# Patient Record
Sex: Female | Born: 1970 | Race: White | Hispanic: No | Marital: Married | Smoking: Former smoker
Health system: Southern US, Community
[De-identification: ages and names within clinical notes are randomized; demographics above are authoritative.]

## PROBLEM LIST (undated history)

## (undated) DIAGNOSIS — I10 Essential (primary) hypertension: Secondary | ICD-10-CM

## (undated) DIAGNOSIS — F419 Anxiety disorder, unspecified: Secondary | ICD-10-CM

## (undated) HISTORY — PX: TUBAL LIGATION: SHX77

## (undated) HISTORY — DX: Essential (primary) hypertension: I10

## (undated) HISTORY — PX: BUNIONECTOMY: SHX129

## (undated) HISTORY — PX: WISDOM TOOTH EXTRACTION: SHX21

## (undated) HISTORY — DX: Anxiety disorder, unspecified: F41.9

---

## 2003-09-21 ENCOUNTER — Other Ambulatory Visit: Admission: RE | Admit: 2003-09-21 | Discharge: 2003-09-21 | Payer: Self-pay | Admitting: Obstetrics and Gynecology

## 2004-05-10 ENCOUNTER — Other Ambulatory Visit: Admission: RE | Admit: 2004-05-10 | Discharge: 2004-05-10 | Payer: Self-pay | Admitting: Obstetrics and Gynecology

## 2004-10-25 ENCOUNTER — Other Ambulatory Visit: Admission: RE | Admit: 2004-10-25 | Discharge: 2004-10-25 | Payer: Self-pay | Admitting: Obstetrics and Gynecology

## 2005-04-25 ENCOUNTER — Other Ambulatory Visit: Admission: RE | Admit: 2005-04-25 | Discharge: 2005-04-25 | Payer: Self-pay | Admitting: Obstetrics and Gynecology

## 2010-02-13 ENCOUNTER — Ambulatory Visit: Payer: Self-pay | Admitting: Obstetrics & Gynecology

## 2010-02-13 LAB — CONVERTED CEMR LAB
Clue Cells Wet Prep HPF POC: NONE SEEN
TSH: 1.307 microintl units/mL (ref 0.350–4.500)

## 2010-03-20 ENCOUNTER — Ambulatory Visit: Payer: Self-pay | Admitting: Obstetrics & Gynecology

## 2010-03-20 ENCOUNTER — Ambulatory Visit (HOSPITAL_COMMUNITY): Admission: RE | Admit: 2010-03-20 | Discharge: 2010-03-20 | Payer: Self-pay | Admitting: Obstetrics & Gynecology

## 2010-05-07 ENCOUNTER — Ambulatory Visit: Payer: Self-pay | Admitting: Obstetrics & Gynecology

## 2010-09-04 ENCOUNTER — Ambulatory Visit: Payer: Self-pay | Admitting: Obstetrics & Gynecology

## 2011-02-11 LAB — CBC
HCT: 43.4 % (ref 36.0–46.0)
Hemoglobin: 14.7 g/dL (ref 12.0–15.0)
MCHC: 33.8 g/dL (ref 30.0–36.0)
MCV: 90.8 fL (ref 78.0–100.0)
RBC: 4.78 MIL/uL (ref 3.87–5.11)
RDW: 14.1 % (ref 11.5–15.5)

## 2011-04-08 NOTE — Assessment & Plan Note (Signed)
[name], SHOE                ACCOUNT NO.:  [id number]   MEDICAL RECORD NO.:  [id number]          PATIENT TYPE:  POB   LOCATION:  [facility] at [place]         FACILITY:  [facility]   PHYSICIAN:  Allie Bossier, MD        DATE OF BIRTH:  02/09/1971   DATE OF SERVICE:  05/07/2010                                  CLINIC NOTE   Ms. Katie Faulkner underwent an uncomplicated laparoscopic application of Filshie  clips on March 20, 2010.  She has had intercourse since then and denies  any problems with that.  She had a period on Apr 20, 2010.  She has no  particular GYN complaints.  She said she is under lot of stress due to  her long work schedule (14 hours a day).  I have recommended yoga and  that she come back in August or September for her annual exam.  We will  try to get her records again from Dr. Henderson Cloud.      Allie Bossier, MD     MCD/MEDQ  D:  05/07/2010  T:  05/08/2010  Job:  [id number]

## 2011-04-08 NOTE — Assessment & Plan Note (Signed)
NAMESADYE, KIERNAN                ACCOUNT NO.:  [id number]   MEDICAL RECORD NO.:  [id number]          PATIENT TYPE:  POB   LOCATION:  [facility] [place]         FACILITY:  [facility]   PHYSICIAN:  Allie Bossier, MD        DATE OF BIRTH:  1971/01/11   DATE OF SERVICE:  02/13/2010                                  CLINIC NOTE   Ms. Katie Faulkner is a 40 year old married white gravida 2, para 1, abortus 54  with 36 year old son.  She comes in here today for several reasons.  She  would like to have her tubes tied.  Her husband has been promising to  have a vasectomy, but has yet to do it and she is certain she does not  want more children.  In fact, she was previously scheduled for a tubal  with Dr. Huntley Dec, but there were some reason that it was not done at the  time it was scheduled.  She also complains that she misses periods.  She  has about 8 periods a year if unlucky.  She said she is under a lot of  stress recently and has not had her TSH checked.   PAST MEDICAL HISTORY:  History of ulcerative colitis, but she has not  had a colonoscopy since about 2006 and currently does not see a  gastroenterologist.  She reports being under a lot of stress.  In fact,  she works for [employer] and is going to have to go to [place] for the next 2  weeks for job related issues.  She also has a history of preeclampsia,  but had never been treated for blood pressure.   MEDICATIONS:  Cymbalta 60 mg daily, Benefiber, and Shaklee vitamin.   FAMILY HISTORY:  Negative for breast, GYN, and colon malignancies.   SOCIAL HISTORY:  Positive for daily wine and 3-4 cigarettes a day, but  she denies drug use.   REVIEW OF SYSTEMS:  She has been married for 17 years and monogamous for  21.  Her family practice Sofiah Lyne is Judie Bonus who is a PA at  [employer].  Her Pap smears have always been normal  since 2007 and she rarely has an orgasm.   PAST SURGICAL HISTORY:  C-section, bilateral bunionectomy with  screws  placed in her foot, and an appendectomy.   PHYSICAL EXAMINATION:  VITAL SIGNS:  Height 5 feet 5 inches, weight 111  pounds, blood pressure 145/91, pulse 78.  PELVIC:  Her external genitalia is normal.  Her speculum exam shows a  nulliparous cervix and she has a small amount of white discharge.  Please note that she was treated with Flagyl in the last month.  Bimanual exam, her uterus is normal size and shape, retroverted,  minimally mobile, and nontender.  Adnexa are nontender and without  masses.   ASSESSMENT AND PLAN:  1. Desire for sterility.  I have turned in paperwork for her tubal      ligation.  She understands it is permanent.  She declines      alternative forms of birth control.  2. Followup of her previously treated BV.  I have sent a wet prep.  3. Oligomenorrhea.  I am checking a TSH today, but I feel that her      decrease in number of periods is probably due to her situational      stress.      Allie Bossier, MD     MCD/MEDQ  D:  02/13/2010  T:  02/14/2010  Job:  [id number]

## 2011-04-08 NOTE — Assessment & Plan Note (Signed)
Katie Faulkner, Katie Faulkner                ACCOUNT NO.:  [id number]   MEDICAL RECORD NO.:  [id number]          PATIENT TYPE:  POB   LOCATION:  [facility] [place]         FACILITY:  [facility]   PHYSICIAN:  Allie Bossier, MD        DATE OF BIRTH:  [birth year]   DATE OF SERVICE:  09/04/2010                                  CLINIC NOTE   Katie Faulkner is a 40 year old married white G2, P1, A1.  She has a 13-year-  old son.  Her only complaint today at her annual exam is that she was  seen in the MAU last month for an episode of hematuria, that is the  first and last occasion of that.  She has no urinary symptoms today.   PAST MEDICAL HISTORY:  Ulcerative colitis, history of preeclampsia with  her pregnancy, depression.   REVIEW OF SYSTEMS:  She is very stressed.  She works for __________.  She is married for 18 years, has been monogamous for 21.  She denies  dyspareunia but reports that she is rarely orgasmic.  Pap smears have  always been normal.  The remainder of her review of systems questions  are negative.   MEDICATIONS:  Cymbalta 60 mg daily, Xanax 1 mg at night, trazodone at  night.   FAMILY HISTORY:  Negative for breast, GYN, and colon malignancies.   SOCIAL HISTORY:  She drinks 1-2 glasses of wine a day and she smokes 1-2  cigarettes every evening.   PHYSICAL EXAMINATION:  VITAL SIGNS:  Height 5 feet 5 inches, weight 114,  blood pressure 112/81, pulse 75.  HEENT:  Normal.  HEART:  Regular rate and rhythm.  BREASTS:  Normal bilaterally.  She has fibrocystic changes throughout.  No nipple discharge.  No discrete masses.  No skin changes.  ABDOMEN:  Scaphoid.  No palpable hepatosplenomegaly.  EXTERNAL GENITALIA:  Entirely normal.  Cervix normal.  Bimanual exam,  her uterus is normal size and shape, midplane, it is deviated slightly  to her left.  Adnexa are nontender and there are no masses.   ASSESSMENT AND PLAN:  Annual exam.  I have checked a Pap smear.  Recommended self-breast and  self-vulvar exams.  Recommended stress  management.      Allie Bossier, MD     MCD/MEDQ  D:  09/04/2010  T:  09/05/2010  Job:  [id number]

## 2011-09-30 ENCOUNTER — Ambulatory Visit (INDEPENDENT_AMBULATORY_CARE_PROVIDER_SITE_OTHER): Payer: BC Managed Care – PPO | Admitting: Obstetrics & Gynecology

## 2011-09-30 ENCOUNTER — Encounter: Payer: Self-pay | Admitting: Obstetrics & Gynecology

## 2011-09-30 VITALS — BP 123/81 | HR 67 | Temp 98.6°F | Resp 16 | Ht 64.5 in | Wt 119.0 lb

## 2011-09-30 DIAGNOSIS — Z0000 Encounter for general adult medical examination without abnormal findings: Secondary | ICD-10-CM

## 2011-09-30 DIAGNOSIS — N898 Other specified noninflammatory disorders of vagina: Secondary | ICD-10-CM

## 2011-09-30 DIAGNOSIS — Z01419 Encounter for gynecological examination (general) (routine) without abnormal findings: Secondary | ICD-10-CM

## 2011-09-30 DIAGNOSIS — Z1272 Encounter for screening for malignant neoplasm of vagina: Secondary | ICD-10-CM

## 2011-09-30 DIAGNOSIS — Z113 Encounter for screening for infections with a predominantly sexual mode of transmission: Secondary | ICD-10-CM

## 2011-09-30 NOTE — Progress Notes (Signed)
Subjective:    Katie Faulkner is a 40 y.o. female who presents for an annual exam. She thinks that she may have a yeast infection "for a while". The patient is sexually active. GYN screening history: last pap: was normal. The patient wears seatbelts: yes. The patient participates in regular exercise: yes. Has the patient ever been transfused or tattooed?: no. The patient reports that there is not domestic violence in her life.   Menstrual History: OB History    Grav Para Term Preterm Abortions TAB SAB Ect Mult Living   2 1   1  1   1       Menarche age: 45 Patient's last menstrual period was 08/30/2011.    The following portions of the patient's history were reviewed and updated as appropriate: allergies, current medications, past family history, past medical history, past social history, past surgical history and problem list.  Review of Systems A comprehensive review of systems was negative.  She has been married for 19 years and is rarely sexually active, still works for [employer], travels extensively for work, has a 51 yo son   Objective:    BP 123/81  Pulse 67  Temp(Src) 98.6 F (37 C) (Oral)  Resp 16  Ht 5' 4.5" (1.638 m)  Wt 53.978 kg (119 lb)  BMI 20.11 kg/m2  LMP 08/30/2011  General Appearance:    Alert, cooperative, no distress, appears stated age  Head:    Normocephalic, without obvious abnormality, atraumatic  Eyes:    PERRL, conjunctiva/corneas clear, EOM's intact, fundi    benign, both eyes  Ears:    Normal TM's and external ear canals, both ears  Nose:   Nares normal, septum midline, mucosa normal, no drainage    or sinus tenderness  Throat:   Lips, mucosa, and tongue normal; teeth and gums normal  Neck:   Supple, symmetrical, trachea midline, no adenopathy;    thyroid:  no enlargement/tenderness/nodules; no carotid   bruit or JVD  Back:     Symmetric, no curvature, ROM normal, no CVA tenderness  Lungs:     Clear to auscultation bilaterally, respirations unlabored    Chest Wall:    No tenderness or deformity   Heart:    Regular rate and rhythm, S1 and S2 normal, no murmur, rub   or gallop  Breast Exam:    No tenderness, masses, or nipple abnormality  Abdomen:     Soft, non-tender, bowel sounds active all four quadrants,    no masses, no organomegaly  Genitalia:    Normal female without lesion, discharge or tenderness, uterus NSS,midplane, relatively non-mobile, adnexa without masses or tenderness     Extremities:   Extremities normal, atraumatic, no cyanosis or edema  Pulses:   2+ and symmetric all extremities  Skin:   Skin color, texture, turgor normal, no rashes or lesions  Lymph nodes:   Cervical, supraclavicular, and axillary nodes normal  Neurologic:   CNII-XII intact, normal strength, sensation and reflexes    throughout  .    Assessment:    Healthy female exam.    Plan:     Thin prep Pap smear.  I will send a wet prep and treat accordingly.

## 2011-10-01 LAB — WET PREP, GENITAL: WBC, Wet Prep HPF POC: NONE SEEN

## 2011-10-20 ENCOUNTER — Telehealth: Payer: Self-pay | Admitting: *Deleted

## 2011-10-20 DIAGNOSIS — B373 Candidiasis of vulva and vagina: Secondary | ICD-10-CM

## 2011-10-20 MED ORDER — FLUCONAZOLE 150 MG PO TABS: 150.0000 mg | ORAL_TABLET | Freq: Once | ORAL | Status: AC

## 2011-10-20 NOTE — Telephone Encounter (Signed)
Pt called with c/o of vaginal itching and vaginal burning.  She has been on 2 courses of antibiotics recently for sinus infection.  She feels like she has a yeast infection and requesting Diflucan if possible.  Rx sent to [facility]  OK'd per Dr Marice Potter.

## 2012-09-01 ENCOUNTER — Other Ambulatory Visit: Payer: Self-pay | Admitting: Obstetrics & Gynecology

## 2012-09-01 DIAGNOSIS — Z1231 Encounter for screening mammogram for malignant neoplasm of breast: Secondary | ICD-10-CM

## 2012-10-19 ENCOUNTER — Encounter: Payer: Self-pay | Admitting: Obstetrics & Gynecology

## 2012-10-19 ENCOUNTER — Ambulatory Visit: Payer: BC Managed Care – PPO

## 2012-10-19 ENCOUNTER — Ambulatory Visit (INDEPENDENT_AMBULATORY_CARE_PROVIDER_SITE_OTHER): Payer: BC Managed Care – PPO | Admitting: Obstetrics & Gynecology

## 2012-10-19 ENCOUNTER — Ambulatory Visit (HOSPITAL_BASED_OUTPATIENT_CLINIC_OR_DEPARTMENT_OTHER): Payer: BC Managed Care – PPO

## 2012-10-19 ENCOUNTER — Ambulatory Visit (HOSPITAL_BASED_OUTPATIENT_CLINIC_OR_DEPARTMENT_OTHER)
Admission: RE | Admit: 2012-10-19 | Discharge: 2012-10-19 | Disposition: A | Discharge status: Home / Self Care | Payer: BC Managed Care – PPO | Source: Ambulatory Visit | Attending: Obstetrics & Gynecology | Admitting: Obstetrics & Gynecology

## 2012-10-19 VITALS — BP 125/81 | HR 67 | Resp 16 | Ht 65.0 in | Wt 126.0 lb

## 2012-10-19 DIAGNOSIS — Z01419 Encounter for gynecological examination (general) (routine) without abnormal findings: Secondary | ICD-10-CM

## 2012-10-19 DIAGNOSIS — Z0000 Encounter for general adult medical examination without abnormal findings: Secondary | ICD-10-CM

## 2012-10-19 DIAGNOSIS — Z1231 Encounter for screening mammogram for malignant neoplasm of breast: Secondary | ICD-10-CM | POA: Insufficient documentation

## 2012-10-19 DIAGNOSIS — Z1151 Encounter for screening for human papillomavirus (HPV): Secondary | ICD-10-CM

## 2012-10-19 DIAGNOSIS — Z124 Encounter for screening for malignant neoplasm of cervix: Secondary | ICD-10-CM

## 2012-10-19 NOTE — Progress Notes (Signed)
Subjective:    Katie Faulkner is a 41 y.o. female who presents for an annual exam. The patient has no complaints today. The patient is sexually active. GYN screening history: last pap: was normal. The patient wears seatbelts: yes. The patient participates in regular exercise: no. Has the patient ever been transfused or tattooed?: no. The patient reports that there is not domestic violence in her life.   Menstrual History: OB History    Grav Para Term Preterm Abortions TAB SAB Ect Mult Living   2 1   1  1   1       Menarche age: 71 Patient's last menstrual period was 10/12/2012.    The following portions of the patient's history were reviewed and updated as appropriate: allergies, current medications, past family history, past medical history, past social history, past surgical history and problem list.  Review of Systems A comprehensive review of systems was negative. She has been married for 20 years and denies dyspareunia. She had a mammogram today. She had a flu shot 09-28-12   Objective:    BP 125/81  Pulse 67  Resp 16  Ht 5\' 5"  (1.651 m)  Wt 126 lb (57.153 kg)  BMI 20.97 kg/m2  LMP 10/12/2012  General Appearance:    Alert, cooperative, no distress, appears stated age  Head:    Normocephalic, without obvious abnormality, atraumatic  Eyes:    PERRL, conjunctiva/corneas clear, EOM's intact, fundi    benign, both eyes  Ears:    Normal TM's and external ear canals, both ears  Nose:   Nares normal, septum midline, mucosa normal, no drainage    or sinus tenderness  Throat:   Lips, mucosa, and tongue normal; teeth and gums normal  Neck:   Supple, symmetrical, trachea midline, no adenopathy;    thyroid:  no enlargement/tenderness/nodules; no carotid   bruit or JVD  Back:     Symmetric, no curvature, ROM normal, no CVA tenderness  Lungs:     Clear to auscultation bilaterally, respirations unlabored  Chest Wall:    No tenderness or deformity   Heart:    Regular rate and rhythm, S1 and  S2 normal, no murmur, rub   or gallop  Breast Exam:    No tenderness, masses, or nipple abnormality  Abdomen:     Soft, non-tender, bowel sounds active all four quadrants,    no masses, no organomegaly  Genitalia:    Normal female without lesion, discharge or tenderness, NSSR, NT, no adnexal masses or tenderness     Extremities:   Extremities normal, atraumatic, no cyanosis or edema  Pulses:   2+ and symmetric all extremities  Skin:   Skin color, texture, turgor normal, no rashes or lesions  Lymph nodes:   Cervical, supraclavicular, and axillary nodes normal  Neurologic:   CNII-XII intact, normal strength, sensation and reflexes    throughout  .    Assessment:    Healthy female exam.    Plan:     Thin prep Pap smear.

## 2013-03-29 ENCOUNTER — Encounter: Payer: Self-pay | Admitting: Obstetrics & Gynecology

## 2013-03-29 ENCOUNTER — Ambulatory Visit (INDEPENDENT_AMBULATORY_CARE_PROVIDER_SITE_OTHER): Payer: BC Managed Care – PPO | Admitting: Obstetrics & Gynecology

## 2013-03-29 VITALS — BP 140/86 | HR 66 | Resp 16 | Ht 65.0 in | Wt 120.0 lb

## 2013-03-29 DIAGNOSIS — N898 Other specified noninflammatory disorders of vagina: Secondary | ICD-10-CM

## 2013-03-29 DIAGNOSIS — L293 Anogenital pruritus, unspecified: Secondary | ICD-10-CM

## 2013-03-29 NOTE — Progress Notes (Signed)
  Subjective:    Patient ID: Katie Faulkner, female    DOB: 05-02-1971, 42 y.o.   MRN: [id number]  HPI  42 yo separated W P1 (51 yo son) who is here because of a 2 week h/o vaginal itching. She tried OTC monistat without relief. She did have unprotected sex (She has had a BTL). She mentions that since her BTL her periods have become longer and heavier.  Review of Systems     Objective:   Physical Exam  Thin, whitish discharge, no yeast- like curds      Assessment & Plan:  Vaginal discharge- await lab results. She declines flagyl for BV (wants to drink wine) I have recommended condoms with sex for STI protection Heavy periods- She has mentioned that she might be interested in an ablation. I said that if she wants to pursue this that I will need to do CBC, TSH, and gyn u/s.

## 2013-03-30 ENCOUNTER — Telehealth: Payer: Self-pay | Admitting: *Deleted

## 2013-03-30 DIAGNOSIS — B9689 Other specified bacterial agents as the cause of diseases classified elsewhere: Secondary | ICD-10-CM

## 2013-03-30 LAB — WET PREP BY MOLECULAR PROBE
Candida species: NEGATIVE
Gardnerella vaginalis: POSITIVE — AB

## 2013-03-30 MED ORDER — METRONIDAZOLE 500 MG PO TABS: 500.0000 mg | ORAL_TABLET | Freq: Two times a day (BID) | ORAL | Status: DC

## 2013-03-30 NOTE — Telephone Encounter (Signed)
LM on voicemail that her wet prep did show BV and RX for Flagyl sent to her pharmacy.

## 2013-04-05 ENCOUNTER — Telehealth: Payer: Self-pay | Admitting: *Deleted

## 2013-04-05 DIAGNOSIS — N926 Irregular menstruation, unspecified: Secondary | ICD-10-CM

## 2013-04-05 MED ORDER — NORGESTREL-ETHINYL ESTRADIOL 0.3-30 MG-MCG PO TABS: 1.0000 | ORAL_TABLET | Freq: Every day | ORAL | Status: DC

## 2013-04-05 NOTE — Telephone Encounter (Signed)
Pt called wanted to start OCP's today instead of going ahead with an ablation.  Spoke with Dr Marice Potter and she RX Lo Ovral.  This was sent to [facility].

## 2013-04-22 ENCOUNTER — Telehealth: Payer: Self-pay | Admitting: *Deleted

## 2013-04-22 DIAGNOSIS — B373 Candidiasis of vulva and vagina: Secondary | ICD-10-CM

## 2013-04-22 MED ORDER — TERCONAZOLE 0.4 % VA CREA: 1.0000 | TOPICAL_CREAM | Freq: Every day | VAGINAL | Status: DC

## 2013-04-22 NOTE — Telephone Encounter (Signed)
Pt called with c/o's of vaginal yeast with discharge and itching.  Requested a RX for Terazol 7 be sent to [facility].

## 2013-05-05 ENCOUNTER — Ambulatory Visit (INDEPENDENT_AMBULATORY_CARE_PROVIDER_SITE_OTHER): Payer: BC Managed Care – PPO | Admitting: Obstetrics & Gynecology

## 2013-05-05 ENCOUNTER — Encounter: Payer: Self-pay | Admitting: Obstetrics & Gynecology

## 2013-05-05 VITALS — BP 136/89 | HR 81 | Resp 16 | Ht 65.0 in

## 2013-05-05 DIAGNOSIS — L292 Pruritus vulvae: Secondary | ICD-10-CM

## 2013-05-05 DIAGNOSIS — L293 Anogenital pruritus, unspecified: Secondary | ICD-10-CM

## 2013-05-05 NOTE — Progress Notes (Signed)
  Subjective:    Patient ID: Katie Faulkner, female    DOB: 12/31/1970, 42 y.o.   MRN: [id number]  HPI  Ms. Mays is here because of severe external vulvar itching. She has taken flagyl, Terazol, and tried OTC Monistat without relief.  Review of Systems     Objective:   Physical Exam  Normal appearing vulva/vagina/discharge. No odor noted.      Assessment & Plan:   Vulvar itching- wet prep sent I have recommended OTC cortisone with aloe until results are available Check HSV 2 IGg

## 2013-05-06 ENCOUNTER — Telehealth: Payer: Self-pay | Admitting: *Deleted

## 2013-05-06 DIAGNOSIS — N760 Acute vaginitis: Secondary | ICD-10-CM

## 2013-05-06 DIAGNOSIS — B9689 Other specified bacterial agents as the cause of diseases classified elsewhere: Secondary | ICD-10-CM

## 2013-05-06 LAB — WET PREP, GENITAL: Yeast Wet Prep HPF POC: NONE SEEN

## 2013-05-06 MED ORDER — METRONIDAZOLE 500 MG PO TABS: 500.0000 mg | ORAL_TABLET | Freq: Two times a day (BID) | ORAL | Status: DC

## 2013-05-06 NOTE — Telephone Encounter (Signed)
Pt notified of positive BV and [facility] 7 days.

## 2013-05-25 ENCOUNTER — Telehealth: Payer: Self-pay | Admitting: *Deleted

## 2013-05-25 DIAGNOSIS — B373 Candidiasis of vulva and vagina: Secondary | ICD-10-CM

## 2013-05-25 MED ORDER — FLUCONAZOLE 150 MG PO TABS: ORAL_TABLET | ORAL | Status: DC

## 2013-05-25 NOTE — Telephone Encounter (Signed)
Pt called with c/o's vaginal itching and discharge.  She has been swimming a lot and states that she has a yeast infection.  Per Dr Marice Potter may call in Diflucan 150 mg

## 2013-07-29 ENCOUNTER — Telehealth: Payer: Self-pay | Admitting: *Deleted

## 2013-07-29 DIAGNOSIS — B9689 Other specified bacterial agents as the cause of diseases classified elsewhere: Secondary | ICD-10-CM

## 2013-07-29 MED ORDER — METRONIDAZOLE 500 MG PO TABS: 500.0000 mg | ORAL_TABLET | Freq: Two times a day (BID) | ORAL | Status: DC

## 2013-07-29 NOTE — Telephone Encounter (Signed)
Pt called adv has Hx of BV and wanted Flagyl sent to pharm - Pt is currently in [place] but will be back after we are closed for the weekend - sent meds to pharm

## 2013-10-26 ENCOUNTER — Encounter: Payer: Self-pay | Admitting: Obstetrics & Gynecology

## 2013-10-26 ENCOUNTER — Ambulatory Visit (INDEPENDENT_AMBULATORY_CARE_PROVIDER_SITE_OTHER): Payer: BC Managed Care – PPO | Admitting: Obstetrics & Gynecology

## 2013-10-26 VITALS — BP 124/83 | HR 65 | Resp 16 | Ht 65.0 in | Wt 134.0 lb

## 2013-10-26 DIAGNOSIS — Z01419 Encounter for gynecological examination (general) (routine) without abnormal findings: Secondary | ICD-10-CM

## 2013-10-26 DIAGNOSIS — N926 Irregular menstruation, unspecified: Secondary | ICD-10-CM

## 2013-10-26 DIAGNOSIS — Z0000 Encounter for general adult medical examination without abnormal findings: Secondary | ICD-10-CM

## 2013-10-26 DIAGNOSIS — Z124 Encounter for screening for malignant neoplasm of cervix: Secondary | ICD-10-CM

## 2013-10-26 DIAGNOSIS — Z1151 Encounter for screening for human papillomavirus (HPV): Secondary | ICD-10-CM

## 2013-10-26 MED ORDER — NORGESTREL-ETHINYL ESTRADIOL 0.3-30 MG-MCG PO TABS: ORAL_TABLET | ORAL | Status: DC

## 2013-10-26 NOTE — Progress Notes (Signed)
Subjective:    Katie Faulkner is a 42 y.o. female who presents for an annual exam. The patient has no complaints today. The patient is sexually active. GYN screening history: last pap: was normal. The patient wears seatbelts: yes. The patient participates in regular exercise: yes. ( walking) Has the patient ever been transfused or tattooed?: no. The patient reports that there is not domestic violence in her life.   Menstrual History: OB History   Grav Para Term Preterm Abortions TAB SAB Ect Mult Living   2 1 1  1  1   1       Menarche age: 36 Coitarche: 4   Patient's last menstrual period was 09/26/2013.    The following portions of the patient's history were reviewed and updated as appropriate: allergies, current medications, past family history, past medical history, past social history, past surgical history and problem list.  Review of Systems A comprehensive review of systems was negative. Separated for 9 months, plans a divorce. Monogamous for 6 months. Using OCPs without condoms. Working for [employer]. She has occasional hot flashes.   Objective:    BP 124/83  Pulse 65  Resp 16  Ht 5\' 5"  (1.651 m)  Wt 134 lb (60.782 kg)  BMI 22.30 kg/m2  LMP 09/26/2013  General Appearance:    Alert, cooperative, no distress, appears stated age  Head:    Normocephalic, without obvious abnormality, atraumatic  Eyes:    PERRL, conjunctiva/corneas clear, EOM's intact, fundi    benign, both eyes  Ears:    Normal TM's and external ear canals, both ears  Nose:   Nares normal, septum midline, mucosa normal, no drainage    or sinus tenderness  Throat:   Lips, mucosa, and tongue normal; teeth and gums normal  Neck:   Supple, symmetrical, trachea midline, no adenopathy;    thyroid:  no enlargement/tenderness/nodules; no carotid   bruit or JVD  Back:     Symmetric, no curvature, ROM normal, no CVA tenderness  Lungs:     Clear to auscultation bilaterally, respirations unlabored  Chest Wall:    No  tenderness or deformity   Heart:    Regular rate and rhythm, S1 and S2 normal, no murmur, rub   or gallop  Breast Exam:    No tenderness, masses, or nipple abnormality  Abdomen:     Soft, non-tender, bowel sounds active all four quadrants,    no masses, no organomegaly  Genitalia:    Normal female without lesion, discharge or tenderness, NSSR, NT, normal adnexal exam     Extremities:   Extremities normal, atraumatic, no cyanosis or edema  Pulses:   2+ and symmetric all extremities  Skin:   Skin color, texture, turgor normal, no rashes or lesions  Lymph nodes:   Cervical, supraclavicular, and axillary nodes normal  Neurologic:   CNII-XII intact, normal strength, sensation and reflexes    throughout  .    Assessment:    Healthy female exam.    Plan:     Breast self exam technique reviewed and patient encouraged to perform self-exam monthly. Chlamydia specimen. GC specimen. Mammogram. Thin prep Pap smear.

## 2013-10-27 ENCOUNTER — Telehealth: Payer: Self-pay | Admitting: *Deleted

## 2013-10-27 ENCOUNTER — Ambulatory Visit: Payer: BC Managed Care – PPO

## 2013-10-27 LAB — GC/CHLAMYDIA PROBE AMP, URINE: GC Probe Amp, Urine: NEGATIVE

## 2013-10-27 NOTE — Telephone Encounter (Signed)
Pt notified of neg cultures from 12/14

## 2013-11-03 ENCOUNTER — Ambulatory Visit (INDEPENDENT_AMBULATORY_CARE_PROVIDER_SITE_OTHER): Payer: BC Managed Care – PPO

## 2013-11-03 DIAGNOSIS — Z0000 Encounter for general adult medical examination without abnormal findings: Secondary | ICD-10-CM

## 2013-11-03 DIAGNOSIS — Z1231 Encounter for screening mammogram for malignant neoplasm of breast: Secondary | ICD-10-CM

## 2014-02-20 ENCOUNTER — Telehealth: Payer: Self-pay | Admitting: *Deleted

## 2014-02-20 NOTE — Telephone Encounter (Signed)
Pt called stating that she missed a pill last week but then doubled up the pills for 1 day to get back on track.  She then started bleeding and has been bleeding x 10 days.  Instructed to stop OCP's and restart a new pill pack on Sunday..Marland Kitchen

## 2014-09-25 ENCOUNTER — Encounter: Payer: Self-pay | Admitting: Obstetrics & Gynecology

## 2014-09-29 ENCOUNTER — Other Ambulatory Visit: Payer: Self-pay | Admitting: Obstetrics & Gynecology

## 2014-09-29 DIAGNOSIS — Z9289 Personal history of other medical treatment: Secondary | ICD-10-CM

## 2014-11-01 ENCOUNTER — Ambulatory Visit (INDEPENDENT_AMBULATORY_CARE_PROVIDER_SITE_OTHER): Payer: BC Managed Care – PPO | Admitting: Obstetrics & Gynecology

## 2014-11-01 ENCOUNTER — Encounter: Payer: Self-pay | Admitting: Obstetrics & Gynecology

## 2014-11-01 VITALS — BP 153/95 | HR 77 | Resp 16 | Ht 65.0 in | Wt 137.0 lb

## 2014-11-01 DIAGNOSIS — Z0000 Encounter for general adult medical examination without abnormal findings: Secondary | ICD-10-CM

## 2014-11-01 DIAGNOSIS — Z1151 Encounter for screening for human papillomavirus (HPV): Secondary | ICD-10-CM | POA: Diagnosis not present

## 2014-11-01 DIAGNOSIS — Z01419 Encounter for gynecological examination (general) (routine) without abnormal findings: Secondary | ICD-10-CM | POA: Diagnosis not present

## 2014-11-01 DIAGNOSIS — Z124 Encounter for screening for malignant neoplasm of cervix: Secondary | ICD-10-CM

## 2014-11-01 DIAGNOSIS — Z113 Encounter for screening for infections with a predominantly sexual mode of transmission: Secondary | ICD-10-CM

## 2014-11-01 NOTE — Progress Notes (Signed)
Subjective:    Katie HusbandsDeborah Faulkner is a 43 y.o. female who presents for an annual exam. The patient has no complaints today. The patient is sexually active. GYN screening history: last pap: was normal. The patient wears seatbelts: yes. The patient participates in regular exercise: yes. Has the patient ever been transfused or tattooed?: no. The patient reports that there is not domestic violence in her life.   Menstrual History: OB History    Gravida Para Term Preterm AB TAB SAB Ectopic Multiple Living   2 1 1  1  1   1       Menarche age: [age]  Patient's last menstrual period was 10/24/2014.    The following portions of the patient's history were reviewed and updated as appropriate: allergies, current medications, past family history, past medical history, past social history, past surgical history and problem list.  Review of Systems A comprehensive review of systems was negative. Works at [employer], lots of stress, mother died 2/15. Monogamous for 18 months. Denies dyspareunia. Periods last about 10 days, heavy 3 of those days, declines Mirena/ablation. She has had a BTL. She has already had a flu vaccine this season. She gets fasting labs at work.   Objective:    BP 153/95 mmHg  Pulse 77  Resp 16  Ht 5\' 5"  (1.651 m)  Wt 137 lb (62.143 kg)  BMI 22.80 kg/m2  LMP 10/24/2014  General Appearance:    Alert, cooperative, no distress, appears stated age  Head:    Normocephalic, without obvious abnormality, atraumatic  Eyes:    PERRL, conjunctiva/corneas clear, EOM's intact, fundi    benign, both eyes  Ears:    Normal TM's and external ear canals, both ears  Nose:   Nares normal, septum midline, mucosa normal, no drainage    or sinus tenderness  Throat:   Lips, mucosa, and tongue normal; teeth and gums normal  Neck:   Supple, symmetrical, trachea midline, no adenopathy;    thyroid:  no enlargement/tenderness/nodules; no carotid   bruit or JVD  Back:     Symmetric, no curvature, ROM normal, no CVA  tenderness  Lungs:     Clear to auscultation bilaterally, respirations unlabored  Chest Wall:    No tenderness or deformity   Heart:    Regular rate and rhythm, S1 and S2 normal, no murmur, rub   or gallop  Breast Exam:    No tenderness, masses, or nipple abnormality  Abdomen:     Soft, non-tender, bowel sounds active all four quadrants,    no masses, no organomegaly  Genitalia:    Normal female without lesion, discharge or tenderness     Extremities:   Extremities normal, atraumatic, no cyanosis or edema  Pulses:   2+ and symmetric all extremities  Skin:   Skin color, texture, turgor normal, no rashes or lesions  Lymph nodes:   Cervical, supraclavicular, and axillary nodes normal  Neurologic:   CNII-XII intact, normal strength, sensation and reflexes    throughout  .    Assessment:    Healthy female exam.    Plan:     Breast self exam technique reviewed and patient encouraged to perform self-exam monthly. Chlamydia specimen. GC specimen. Mammogram. Thin prep Pap smear. with cotesting

## 2014-11-02 ENCOUNTER — Telehealth: Payer: Self-pay | Admitting: *Deleted

## 2014-11-02 LAB — HIV ANTIBODY (ROUTINE TESTING W REFLEX): HIV 1&2 Ab, 4th Generation: NONREACTIVE

## 2014-11-02 LAB — RPR

## 2014-11-02 LAB — HEPATITIS C ANTIBODY: HCV AB: NEGATIVE

## 2014-11-02 LAB — HEPATITIS B SURFACE ANTIGEN: Hepatitis B Surface Ag: NEGATIVE

## 2014-11-02 NOTE — Telephone Encounter (Signed)
Called pt to adv labs are normal but still waiting on PAP. Pt expressed understanding.

## 2014-11-03 LAB — CYTOLOGY - PAP

## 2014-11-08 ENCOUNTER — Ambulatory Visit (INDEPENDENT_AMBULATORY_CARE_PROVIDER_SITE_OTHER): Payer: BC Managed Care – PPO

## 2014-11-08 ENCOUNTER — Ambulatory Visit: Payer: BC Managed Care – PPO

## 2014-11-08 DIAGNOSIS — Z1231 Encounter for screening mammogram for malignant neoplasm of breast: Secondary | ICD-10-CM

## 2014-11-08 DIAGNOSIS — Z9289 Personal history of other medical treatment: Secondary | ICD-10-CM

## 2015-10-05 ENCOUNTER — Other Ambulatory Visit (HOSPITAL_COMMUNITY): Payer: Self-pay | Admitting: Obstetrics & Gynecology

## 2015-10-05 DIAGNOSIS — Z1231 Encounter for screening mammogram for malignant neoplasm of breast: Secondary | ICD-10-CM

## 2015-11-15 ENCOUNTER — Ambulatory Visit: Payer: Self-pay | Admitting: Obstetrics & Gynecology

## 2015-11-15 ENCOUNTER — Ambulatory Visit: Payer: Self-pay

## 2015-12-06 ENCOUNTER — Ambulatory Visit: Payer: Self-pay

## 2015-12-06 ENCOUNTER — Ambulatory Visit: Payer: Self-pay | Admitting: Obstetrics & Gynecology

## 2015-12-19 ENCOUNTER — Encounter: Payer: Self-pay | Admitting: Obstetrics & Gynecology

## 2015-12-19 ENCOUNTER — Ambulatory Visit (INDEPENDENT_AMBULATORY_CARE_PROVIDER_SITE_OTHER): Payer: BLUE CROSS/BLUE SHIELD | Admitting: Obstetrics & Gynecology

## 2015-12-19 ENCOUNTER — Ambulatory Visit (INDEPENDENT_AMBULATORY_CARE_PROVIDER_SITE_OTHER): Payer: BLUE CROSS/BLUE SHIELD

## 2015-12-19 VITALS — BP 108/77 | HR 79 | Resp 16 | Ht 65.0 in | Wt 134.0 lb

## 2015-12-19 DIAGNOSIS — Z01419 Encounter for gynecological examination (general) (routine) without abnormal findings: Secondary | ICD-10-CM | POA: Diagnosis not present

## 2015-12-19 DIAGNOSIS — Z1231 Encounter for screening mammogram for malignant neoplasm of breast: Secondary | ICD-10-CM | POA: Diagnosis not present

## 2015-12-19 DIAGNOSIS — Z36 Encounter for antenatal screening of mother: Secondary | ICD-10-CM

## 2015-12-19 DIAGNOSIS — N920 Excessive and frequent menstruation with regular cycle: Secondary | ICD-10-CM | POA: Diagnosis not present

## 2015-12-19 DIAGNOSIS — Z124 Encounter for screening for malignant neoplasm of cervix: Secondary | ICD-10-CM | POA: Diagnosis not present

## 2015-12-19 DIAGNOSIS — Z1151 Encounter for screening for human papillomavirus (HPV): Secondary | ICD-10-CM | POA: Diagnosis not present

## 2015-12-19 DIAGNOSIS — Z0000 Encounter for general adult medical examination without abnormal findings: Secondary | ICD-10-CM

## 2015-12-19 LAB — CBC
HEMATOCRIT: 43.4 % (ref 36.0–46.0)
HEMOGLOBIN: 14.1 g/dL (ref 12.0–15.0)
MCH: 28.7 pg (ref 26.0–34.0)
MCHC: 32.5 g/dL (ref 30.0–36.0)
MCV: 88.2 fL (ref 78.0–100.0)
MPV: 11.4 fL (ref 8.6–12.4)
Platelets: 252 10*3/uL (ref 150–400)
RBC: 4.92 MIL/uL (ref 3.87–5.11)
RDW: 13.6 % (ref 11.5–15.5)
WBC: 7.6 10*3/uL (ref 4.0–10.5)

## 2015-12-19 NOTE — Progress Notes (Signed)
Subjective:    Katie Faulkner is a 45 y.o. MW  female who presents for an annual exam. The patient has no complaints today. The patient is sexually active. GYN screening history: last pap: was normal. The patient wears seatbelts: yes. The patient participates in regular exercise: yes. Has the patient ever been transfused or tattooed?: no. The patient reports that there is not domestic violence in her life.   Menstrual History: OB History    Gravida Para Term Preterm AB TAB SAB Ectopic Multiple Living   Menarche age: 58  Patient's last menstrual period was 12/09/2015.    The following portions of the patient's history were reviewed and updated as appropriate: allergies, current medications, past family history, past medical history, past social history, past surgical history and problem list.  Review of Systems Pertinent items noted in HPI and remainder of comprehensive ROS otherwise negative.  She got married last year. She denies dyspareunia.She works for  [employer]. She has had a recent flu vaccine and mammogram. She gets her fasting labs at work.   Objective:    BP 108/77 mmHg  Pulse 79  Resp 16  Ht  (1.651 m)  Wt 134 lb (60.782 kg)  BMI 22.30 kg/m2  LMP 12/09/2015  General Appearance:    Alert, cooperative, no distress, appears stated age  Head:    Normocephalic, without obvious abnormality, atraumatic  Eyes:    PERRL, conjunctiva/corneas clear, EOM's intact, fundi    benign, both eyes  Ears:    Normal TM's and external ear canals, both ears  Nose:   Nares normal, septum midline, mucosa normal, no drainage    or sinus tenderness  Throat:   Lips, mucosa, and tongue normal; teeth and gums normal  Neck:   Supple, symmetrical, trachea midline, no adenopathy;    thyroid:  no enlargement/tenderness/nodules; no carotid   bruit or JVD  Back:     Symmetric, no curvature, ROM normal, no CVA tenderness  Lungs:     Clear to auscultation bilaterally, respirations  unlabored  Chest Wall:    No tenderness or deformity   Heart:    Regular rate and rhythm, S1 and S2 normal, no murmur, rub   or gallop  Breast Exam:    No tenderness, masses, or nipple abnormality  Abdomen:     Soft, non-tender, bowel sounds active all four quadrants,    no masses, no organomegaly  Genitalia:    Normal female without lesion, discharge or tenderness, NSSR, NT, mobile, normal adnexal exam     Extremities:   Extremities normal, atraumatic, no cyanosis or edema  Pulses:   2+ and symmetric all extremities  Skin:   Skin color, texture, turgor normal, no rashes or lesions  Lymph nodes:   Cervical, supraclavicular, and axillary nodes normal  Neurologic:   CNII-XII intact, normal strength, sensation and reflexes    throughout  .    Assessment:    Healthy female exam.   Heavy periods   Plan:     Breast self exam technique reviewed and patient encouraged to perform self-exam monthly. Thin prep Pap smear. with cotesting (She is aware of ACOG recs) Check TSH, cbc, and gyn u/s Discussed ablation/Mirena

## 2015-12-20 ENCOUNTER — Telehealth: Payer: Self-pay | Admitting: *Deleted

## 2015-12-20 LAB — TSH: TSH: 1.255 u[IU]/mL (ref 0.350–4.500)

## 2015-12-20 NOTE — Telephone Encounter (Signed)
Copy of normal labs mailed to pt,

## 2015-12-24 LAB — CYTOLOGY - PAP

## 2016-10-28 ENCOUNTER — Other Ambulatory Visit (HOSPITAL_COMMUNITY): Payer: Self-pay | Admitting: Obstetrics & Gynecology

## 2016-10-28 DIAGNOSIS — Z1231 Encounter for screening mammogram for malignant neoplasm of breast: Secondary | ICD-10-CM

## 2016-12-23 ENCOUNTER — Ambulatory Visit: Payer: BLUE CROSS/BLUE SHIELD | Admitting: Obstetrics and Gynecology

## 2016-12-23 ENCOUNTER — Ambulatory Visit: Payer: BLUE CROSS/BLUE SHIELD

## 2017-01-02 ENCOUNTER — Encounter: Payer: Self-pay | Admitting: Advanced Practice Midwife

## 2017-01-02 ENCOUNTER — Ambulatory Visit (INDEPENDENT_AMBULATORY_CARE_PROVIDER_SITE_OTHER): Payer: BLUE CROSS/BLUE SHIELD | Admitting: Advanced Practice Midwife

## 2017-01-02 ENCOUNTER — Ambulatory Visit (INDEPENDENT_AMBULATORY_CARE_PROVIDER_SITE_OTHER): Payer: BLUE CROSS/BLUE SHIELD

## 2017-01-02 VITALS — BP 122/85 | HR 75 | Ht 65.0 in | Wt 130.0 lb

## 2017-01-02 DIAGNOSIS — Z0000 Encounter for general adult medical examination without abnormal findings: Secondary | ICD-10-CM

## 2017-01-02 DIAGNOSIS — Z01419 Encounter for gynecological examination (general) (routine) without abnormal findings: Secondary | ICD-10-CM | POA: Diagnosis not present

## 2017-01-02 DIAGNOSIS — Z1239 Encounter for other screening for malignant neoplasm of breast: Secondary | ICD-10-CM

## 2017-01-02 DIAGNOSIS — N898 Other specified noninflammatory disorders of vagina: Secondary | ICD-10-CM

## 2017-01-02 DIAGNOSIS — N939 Abnormal uterine and vaginal bleeding, unspecified: Secondary | ICD-10-CM

## 2017-01-02 DIAGNOSIS — Z1231 Encounter for screening mammogram for malignant neoplasm of breast: Secondary | ICD-10-CM

## 2017-01-02 NOTE — Progress Notes (Signed)
Subjective:     Patient ID: Katie Faulkner, female   DOB: 03-11-1971, 46 y.o.   MRN: [id number]  HPI Here for annual Gyn exam. Still having heavy periods lasting 5-6 days. 27-28 day cycles. No Change.  No dizziness. Last Pap 11/2015 Nml, but absent TZ, neg HPV. Mammogram 11/2015 Nml. Has one scheduled later today.    Review of Systems  Constitutional: Negative for appetite change, chills, fatigue, fever and unexpected weight change.  Respiratory: Negative for shortness of breath.   Cardiovascular: Negative for chest pain.  Gastrointestinal: Negative for abdominal distention, abdominal pain, constipation, diarrhea, nausea and vomiting.  Genitourinary: Positive for vaginal discharge. Negative for difficulty urinating, dysuria, genital sores, hematuria, urgency, vaginal bleeding and vaginal pain.  Neurological: Negative for dizziness and weakness.       Objective:   Physical Exam  Constitutional: She is oriented to person, place, and time. She appears well-developed and well-nourished. No distress.  HENT:  Head: Atraumatic.  Eyes: Conjunctivae are normal. No scleral icterus.  Neck: No thyromegaly present.  Cardiovascular: Normal rate and regular rhythm.  Exam reveals no gallop and no friction rub.   No murmur heard. Pulmonary/Chest: Effort normal and breath sounds normal. No respiratory distress.  Abdominal: Soft. Bowel sounds are normal. She exhibits no distension. There is no tenderness.  Genitourinary: Uterus normal. Vaginal discharge (thin, clear-white) found.  Musculoskeletal: She exhibits no edema or tenderness.  Lymphadenopathy:    She has no cervical adenopathy.  Neurological: She is alert and oriented to person, place, and time. She has normal reflexes.  Skin: Skin is warm and dry.  Psychiatric: She has a normal mood and affect.  Nursing note and vitals reviewed.      Assessment:     1. Vaginal discharge  - Cervicovaginal ancillary only  2. Breast screening  -  Cervicovaginal ancillary only  3. Encounter for gynecological examination without abnormal finding  - Cervicovaginal ancillary only  4. Abnormal uterine bleeding (AUB)  - Cervicovaginal ancillary only     Plan:     F/I 1 year Discussed Mirena or ablation for AUB--declines  [place] Xaniyah Buchholz, CNM 01/02/2017 12:24 PM

## 2017-01-05 LAB — CERVICOVAGINAL ANCILLARY ONLY
BACTERIAL VAGINITIS: NEGATIVE
Candida vaginitis: NEGATIVE
Chlamydia: NEGATIVE
Neisseria Gonorrhea: NEGATIVE

## 2017-01-08 ENCOUNTER — Ambulatory Visit: Payer: BLUE CROSS/BLUE SHIELD | Admitting: Obstetrics & Gynecology

## 2017-11-09 ENCOUNTER — Other Ambulatory Visit (HOSPITAL_COMMUNITY): Payer: Self-pay | Admitting: Obstetrics & Gynecology

## 2017-11-09 DIAGNOSIS — Z1231 Encounter for screening mammogram for malignant neoplasm of breast: Secondary | ICD-10-CM

## 2018-01-07 ENCOUNTER — Encounter: Payer: Self-pay | Admitting: Obstetrics & Gynecology

## 2018-01-07 ENCOUNTER — Ambulatory Visit (INDEPENDENT_AMBULATORY_CARE_PROVIDER_SITE_OTHER): Payer: BLUE CROSS/BLUE SHIELD

## 2018-01-07 ENCOUNTER — Ambulatory Visit (INDEPENDENT_AMBULATORY_CARE_PROVIDER_SITE_OTHER): Payer: BLUE CROSS/BLUE SHIELD | Admitting: Obstetrics & Gynecology

## 2018-01-07 VITALS — BP 116/72 | HR 79 | Wt 133.0 lb

## 2018-01-07 DIAGNOSIS — Z01419 Encounter for gynecological examination (general) (routine) without abnormal findings: Secondary | ICD-10-CM | POA: Diagnosis not present

## 2018-01-07 DIAGNOSIS — Z1151 Encounter for screening for human papillomavirus (HPV): Secondary | ICD-10-CM

## 2018-01-07 DIAGNOSIS — Z124 Encounter for screening for malignant neoplasm of cervix: Secondary | ICD-10-CM | POA: Diagnosis not present

## 2018-01-07 DIAGNOSIS — Z1231 Encounter for screening mammogram for malignant neoplasm of breast: Secondary | ICD-10-CM

## 2018-01-07 NOTE — Progress Notes (Signed)
Subjective:  She is having all the symptoms of menopause, especially right before her periods. Her anus has been itchy for about 2 weeks, has tried Prep H. Periods are lasting about 7days.  The patient is sexually active. GYN screening history: last pap: was normal. The patient wears seatbelts: yes. The patient participates in regular exercise: yes. Has the patient ever been transfused or tattooed?: no. The patient reports that there is not domestic violence in her life.   Menstrual History: OB History    Gravida Para Term Preterm AB Living   2 1 1   1 1    SAB TAB Ectopic Multiple Live Births   1       1      Menarche age: [age] No LMP recorded.    The following portions of the patient's history were reviewed and updated as appropriate: allergies, current medications, past family history, past medical history, past social history, past surgical history and problem list.  Review of Systems Pertinent items are noted in HPI.   Husband has had a vasectomy She has had a BTL Married for 2 1/2 years FH- no breast/gyn/colon cancer She has had a flu vaccine   Objective:    BP 116/72   Pulse 79   Wt 133 lb (60.3 kg)   BMI 22.13 kg/m   General Appearance:    Alert, cooperative, no distress, appears stated age  Head:    Normocephalic, without obvious abnormality, atraumatic  Eyes:    PERRL, conjunctiva/corneas clear, EOM's intact, fundi    benign, both eyes  Ears:    Normal TM's and external ear canals, both ears  Nose:   Nares normal, septum midline, mucosa normal, no drainage    or sinus tenderness  Throat:   Lips, mucosa, and tongue normal; teeth and gums normal  Neck:   Supple, symmetrical, trachea midline, no adenopathy;    thyroid:  no enlargement/tenderness/nodules; no carotid   bruit or JVD  Back:     Symmetric, no curvature, ROM normal, no CVA tenderness  Lungs:     Clear to auscultation bilaterally, respirations unlabored  Chest Wall:    No tenderness or deformity   Heart:     Regular rate and rhythm, S1 and S2 normal, no murmur, rub   or gallop  Breast Exam:    No tenderness, masses, or nipple abnormality  Abdomen:     Soft, non-tender, bowel sounds active all four quadrants,    no masses, no organomegaly  Genitalia:    Normal female without lesion, discharge or tenderness, normal size and shape, retroverted, mobile, non-tender, normal adnexal exam Her anus appears normal     Extremities:   Extremities normal, atraumatic, no cyanosis or edema  Pulses:   2+ and symmetric all extremities  Skin:   Skin color, texture, turgor normal, no rashes or lesions  Lymph nodes:   Cervical, supraclavicular, and axillary nodes normal  Neurologic:   CNII-XII intact, normal strength, sensation and reflexes    throughout  .    Assessment:    Healthy female exam.    Plan:     Thin prep Pap smear. with cotesting

## 2018-01-12 LAB — CYTOLOGY - PAP
Adequacy: ABSENT
DIAGNOSIS: NEGATIVE
HPV (WINDOPATH): NOT DETECTED

## 2018-11-29 ENCOUNTER — Other Ambulatory Visit: Payer: Self-pay | Admitting: Obstetrics & Gynecology

## 2018-11-29 DIAGNOSIS — Z1231 Encounter for screening mammogram for malignant neoplasm of breast: Secondary | ICD-10-CM

## 2019-01-13 ENCOUNTER — Ambulatory Visit (INDEPENDENT_AMBULATORY_CARE_PROVIDER_SITE_OTHER): Payer: BLUE CROSS/BLUE SHIELD

## 2019-01-13 ENCOUNTER — Ambulatory Visit (INDEPENDENT_AMBULATORY_CARE_PROVIDER_SITE_OTHER): Payer: BLUE CROSS/BLUE SHIELD | Admitting: Obstetrics & Gynecology

## 2019-01-13 ENCOUNTER — Encounter: Payer: Self-pay | Admitting: Obstetrics & Gynecology

## 2019-01-13 VITALS — BP 136/89 | HR 79 | Ht 65.0 in | Wt 137.0 lb

## 2019-01-13 DIAGNOSIS — Z23 Encounter for immunization: Secondary | ICD-10-CM

## 2019-01-13 DIAGNOSIS — Z1231 Encounter for screening mammogram for malignant neoplasm of breast: Secondary | ICD-10-CM | POA: Diagnosis not present

## 2019-01-13 DIAGNOSIS — Z124 Encounter for screening for malignant neoplasm of cervix: Secondary | ICD-10-CM | POA: Diagnosis not present

## 2019-01-13 DIAGNOSIS — Z01419 Encounter for gynecological examination (general) (routine) without abnormal findings: Secondary | ICD-10-CM

## 2019-01-13 DIAGNOSIS — Z1151 Encounter for screening for human papillomavirus (HPV): Secondary | ICD-10-CM

## 2019-01-13 MED ORDER — CIPROFLOXACIN HCL 500 MG PO TABS: 500.0000 mg | ORAL_TABLET | Freq: Two times a day (BID) | ORAL | 6 refills | Status: DC

## 2019-01-13 MED ORDER — FLAVOXATE HCL 100 MG PO TABS: 100.0000 mg | ORAL_TABLET | Freq: Three times a day (TID) | ORAL | 1 refills | Status: DC | PRN

## 2019-01-13 NOTE — Progress Notes (Signed)
Subjective:    Katie Faulkner is a 48 y.o.married P1 (63 yo son) female who presents for an annual exam. She has about 3 UTIs per year. She has been seen by urology and had a renal scan with no etiology discovered. She is requesting that I prescribed cipro when she has UTIs. She already takes bactrim after sex.  The patient is sexually active. GYN screening history: last pap: was normal. The patient wears seatbelts: yes. The patient participates in regular exercise: yes. Has the patient ever been transfused or tattooed?: no. The patient reports that there is not domestic violence in her life.   Menstrual History: OB History    Gravida  2   Para  1   Term  1   Preterm      AB  1   Living  1     SAB  1   TAB      Ectopic      Multiple      Live Births  1           Menarche age: 89 Patient's last menstrual period was 01/03/2019.    The following portions of the patient's history were reviewed and updated as appropriate: allergies, current medications, past family history, past medical history, past social history, past surgical history and problem list.  Review of Systems Pertinent items are noted in HPI.   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)   Objective:    BP 136/89   Pulse 79   Ht 5\' 5"  (1.651 m)   Wt 137 lb (62.1 kg)   LMP 01/03/2019   BMI 22.80 kg/m   General Appearance:    Alert, cooperative, no distress, appears stated age  Head:    Normocephalic, without obvious abnormality, atraumatic  Eyes:    PERRL, conjunctiva/corneas clear, EOM's intact, fundi    benign, both eyes  Ears:    Normal TM's and external ear canals, both ears  Nose:   Nares normal, septum midline, mucosa normal, no drainage    or sinus tenderness  Throat:   Lips, mucosa, and tongue normal; teeth and gums normal  Neck:   Supple, symmetrical, trachea midline,  no adenopathy;    thyroid:  no enlargement/tenderness/nodules; no carotid   bruit or JVD  Back:     Symmetric, no curvature, ROM normal, no CVA tenderness  Lungs:     Clear to auscultation bilaterally, respirations unlabored  Chest Wall:    No tenderness or deformity   Heart:    Regular rate and rhythm, S1 and S2 normal, no murmur, rub   or gallop  Breast Exam:    No tenderness, masses, or nipple abnormality  Abdomen:     Soft, non-tender, bowel sounds active all four quadrants,    no masses, no organomegaly  Genitalia:    Normal female without lesion, discharge or tenderness, normal size and shape, anteverted, mobile, non-tender, normal adnexal exam      Extremities:   Extremities normal, atraumatic, no cyanosis or edema  Pulses:   2+ and symmetric all extremities  Skin:   Skin color, texture, turgor normal, no rashes or lesions  Lymph nodes:   Cervical, supraclavicular, and axillary nodes normal  Neurologic:   CNII-XII intact, normal strength, sensation and reflexes    throughout  .    Assessment:  Healthy female exam.   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 next year cipro prn Urispas prn TDAP today

## 2019-01-13 NOTE — Progress Notes (Signed)
Last pap- 01/07/18- negative

## 2019-01-18 LAB — CYTOLOGY - PAP
Diagnosis: NEGATIVE
HPV (WINDOPATH): NOT DETECTED

## 2019-10-04 DIAGNOSIS — D485 Neoplasm of uncertain behavior of skin: Secondary | ICD-10-CM | POA: Diagnosis not present

## 2019-10-04 DIAGNOSIS — L249 Irritant contact dermatitis, unspecified cause: Secondary | ICD-10-CM | POA: Diagnosis not present

## 2019-10-04 DIAGNOSIS — L820 Inflamed seborrheic keratosis: Secondary | ICD-10-CM | POA: Diagnosis not present

## 2019-10-14 DIAGNOSIS — G4700 Insomnia, unspecified: Secondary | ICD-10-CM | POA: Diagnosis not present

## 2019-10-14 DIAGNOSIS — F3341 Major depressive disorder, recurrent, in partial remission: Secondary | ICD-10-CM | POA: Diagnosis not present

## 2019-10-14 DIAGNOSIS — R030 Elevated blood-pressure reading, without diagnosis of hypertension: Secondary | ICD-10-CM | POA: Diagnosis not present

## 2019-10-14 DIAGNOSIS — F419 Anxiety disorder, unspecified: Secondary | ICD-10-CM | POA: Diagnosis not present

## 2019-11-15 DIAGNOSIS — Z79899 Other long term (current) drug therapy: Secondary | ICD-10-CM | POA: Diagnosis not present

## 2019-11-23 DIAGNOSIS — Z79899 Other long term (current) drug therapy: Secondary | ICD-10-CM | POA: Diagnosis not present

## 2019-11-23 DIAGNOSIS — F3341 Major depressive disorder, recurrent, in partial remission: Secondary | ICD-10-CM | POA: Diagnosis not present

## 2019-11-23 DIAGNOSIS — G4700 Insomnia, unspecified: Secondary | ICD-10-CM | POA: Diagnosis not present

## 2019-11-23 DIAGNOSIS — I10 Essential (primary) hypertension: Secondary | ICD-10-CM | POA: Diagnosis not present

## 2019-12-08 ENCOUNTER — Other Ambulatory Visit: Payer: Self-pay | Admitting: Nurse Practitioner

## 2019-12-08 ENCOUNTER — Other Ambulatory Visit: Payer: Self-pay | Admitting: Obstetrics & Gynecology

## 2019-12-08 DIAGNOSIS — Z1239 Encounter for other screening for malignant neoplasm of breast: Secondary | ICD-10-CM

## 2019-12-21 DIAGNOSIS — Z79899 Other long term (current) drug therapy: Secondary | ICD-10-CM | POA: Diagnosis not present

## 2020-01-25 DIAGNOSIS — Z79899 Other long term (current) drug therapy: Secondary | ICD-10-CM | POA: Diagnosis not present

## 2020-01-26 ENCOUNTER — Ambulatory Visit (INDEPENDENT_AMBULATORY_CARE_PROVIDER_SITE_OTHER): Payer: BC Managed Care – PPO

## 2020-01-26 ENCOUNTER — Ambulatory Visit (INDEPENDENT_AMBULATORY_CARE_PROVIDER_SITE_OTHER): Payer: BC Managed Care – PPO | Admitting: Obstetrics & Gynecology

## 2020-01-26 ENCOUNTER — Other Ambulatory Visit: Payer: Self-pay

## 2020-01-26 ENCOUNTER — Encounter: Payer: Self-pay | Admitting: Obstetrics & Gynecology

## 2020-01-26 VITALS — BP 114/80 | HR 85 | Temp 98.4°F | Resp 16 | Ht 65.0 in | Wt 137.0 lb

## 2020-01-26 DIAGNOSIS — K5190 Ulcerative colitis, unspecified, without complications: Secondary | ICD-10-CM | POA: Diagnosis not present

## 2020-01-26 DIAGNOSIS — Z1239 Encounter for other screening for malignant neoplasm of breast: Secondary | ICD-10-CM | POA: Diagnosis not present

## 2020-01-26 DIAGNOSIS — Z1151 Encounter for screening for human papillomavirus (HPV): Secondary | ICD-10-CM | POA: Diagnosis not present

## 2020-01-26 DIAGNOSIS — Z01419 Encounter for gynecological examination (general) (routine) without abnormal findings: Secondary | ICD-10-CM

## 2020-01-26 DIAGNOSIS — Z124 Encounter for screening for malignant neoplasm of cervix: Secondary | ICD-10-CM | POA: Diagnosis not present

## 2020-01-26 DIAGNOSIS — Z1231 Encounter for screening mammogram for malignant neoplasm of breast: Secondary | ICD-10-CM | POA: Diagnosis not present

## 2020-01-26 NOTE — Progress Notes (Signed)
Subjective:    Katie Faulkner is a 49 y.o. married P1 (34 yo son) who presents for an annual exam. The patient has no complaints today. The patient is sexually active. GYN screening history: last pap: was normal. The patient wears seatbelts: yes. The patient participates in regular exercise: yes. (cardio) Has the patient ever been transfused or tattooed?: no. The patient reports that there is not domestic violence in her life.   Menstrual History: OB History    Gravida  2   Para  1   Term  1   Preterm      AB  1   Living  1     SAB  1   TAB      Ectopic      Multiple      Live Births  1           Menarche age: 31 Patient's last menstrual period was 01/22/2020.    The following portions of the patient's history were reviewed and updated as appropriate: allergies, current medications, past family history, past medical history, past social history, past surgical history and problem list.  Review of Systems Pertinent items are noted in HPI.   Married 09/08/15 Works as a Human resources officer She had a [place] in 2011 and her husband had a vasectomy. She gets fasting labs at work at [employer]. Mammogram was done today. She had a flu vaccine this season. She had a colonoscopy when she was diagnosed with UC.   Objective:    BP 114/80   Pulse 85   Temp 98.4 F (36.9 C)   Resp 16   Ht 5\' 5"  (1.651 m)   Wt 137 lb (62.1 kg)   LMP 01/22/2020   BMI 22.80 kg/m   General Appearance:    Alert, cooperative, no distress, appears stated age  Head:    Normocephalic, without obvious abnormality, atraumatic  Eyes:    PERRL, conjunctiva/corneas clear, EOM's intact, fundi    benign, both eyes  Ears:    Normal TM's and external ear canals, both ears  Nose:   Nares normal, septum midline, mucosa normal, no drainage    or sinus tenderness  Throat:   Lips, mucosa, and tongue normal; teeth and gums normal  Neck:   Supple, symmetrical, trachea midline, no adenopathy;    thyroid:  no  enlargement/tenderness/nodules; no carotid   bruit or JVD  Back:     Symmetric, no curvature, ROM normal, no CVA tenderness  Lungs:     Clear to auscultation bilaterally, respirations unlabored  Chest Wall:    No tenderness or deformity   Heart:    Regular rate and rhythm, S1 and S2 normal, no murmur, rub   or gallop  Breast Exam:    No tenderness, masses, or nipple abnormality  Abdomen:     Soft, non-tender, bowel sounds active all four quadrants,    no masses, no organomegaly  Genitalia:    Normal female without lesion, discharge or tenderness, normal size and shape, retroverted, mobile, non-tender, normal adnexal exam      Extremities:   Extremities normal, atraumatic, no cyanosis or edema  Pulses:   2+ and symmetric all extremities  Skin:   Skin color, texture, turgor normal, no rashes or lesions  Lymph nodes:   Cervical, supraclavicular, and axillary nodes normal  Neurologic:   CNII-XII intact, normal strength, sensation and reflexes    throughout  .    Assessment:    Healthy female exam.  Plan:     Thin prep Pap smear. with cotesting Rec colon screening with GI

## 2020-01-27 LAB — CYTOLOGY - PAP
Comment: NEGATIVE
Diagnosis: NEGATIVE
High risk HPV: NEGATIVE

## 2020-04-20 DIAGNOSIS — G4700 Insomnia, unspecified: Secondary | ICD-10-CM | POA: Diagnosis not present

## 2020-04-20 DIAGNOSIS — F3341 Major depressive disorder, recurrent, in partial remission: Secondary | ICD-10-CM | POA: Diagnosis not present

## 2020-04-20 DIAGNOSIS — F419 Anxiety disorder, unspecified: Secondary | ICD-10-CM | POA: Diagnosis not present

## 2020-04-20 DIAGNOSIS — I10 Essential (primary) hypertension: Secondary | ICD-10-CM | POA: Diagnosis not present

## 2020-05-02 DIAGNOSIS — M9902 Segmental and somatic dysfunction of thoracic region: Secondary | ICD-10-CM | POA: Diagnosis not present

## 2020-05-02 DIAGNOSIS — M9901 Segmental and somatic dysfunction of cervical region: Secondary | ICD-10-CM | POA: Diagnosis not present

## 2020-05-02 DIAGNOSIS — M9904 Segmental and somatic dysfunction of sacral region: Secondary | ICD-10-CM | POA: Diagnosis not present

## 2020-05-02 DIAGNOSIS — M9903 Segmental and somatic dysfunction of lumbar region: Secondary | ICD-10-CM | POA: Diagnosis not present

## 2020-05-25 IMAGING — MG DIGITAL SCREENING BILATERAL MAMMOGRAM WITH TOMO AND CAD
8 series · 8 of 24 positions shown · non-contrast
Comparison: Previous exam(s).

CLINICAL DATA: Screening.

EXAM:
DIGITAL SCREENING BILATERAL MAMMOGRAM WITH TOMO AND CAD

[R CC synth-2D]
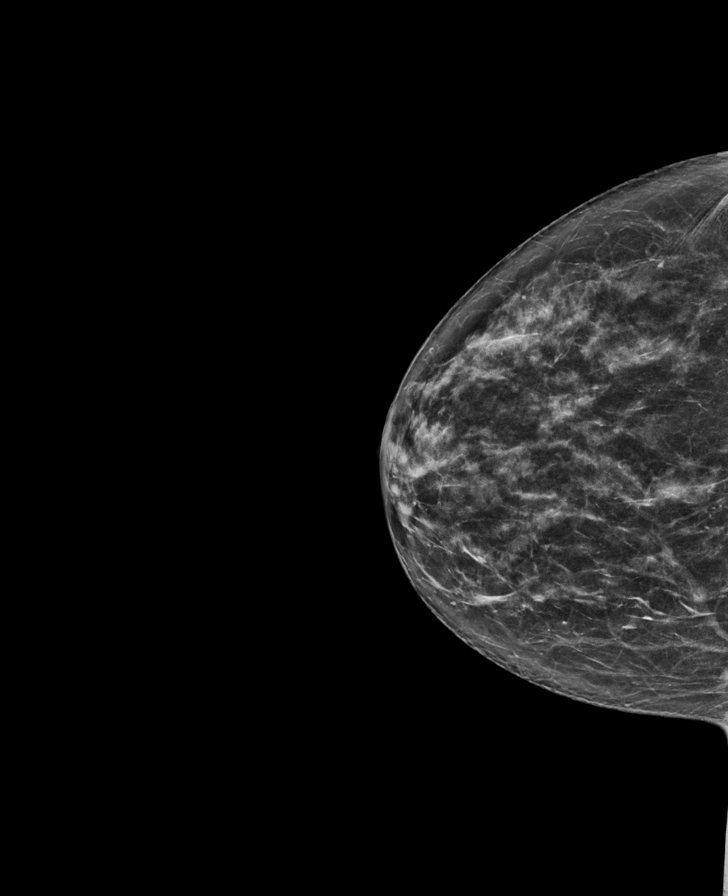

[R MLO synth-2D]
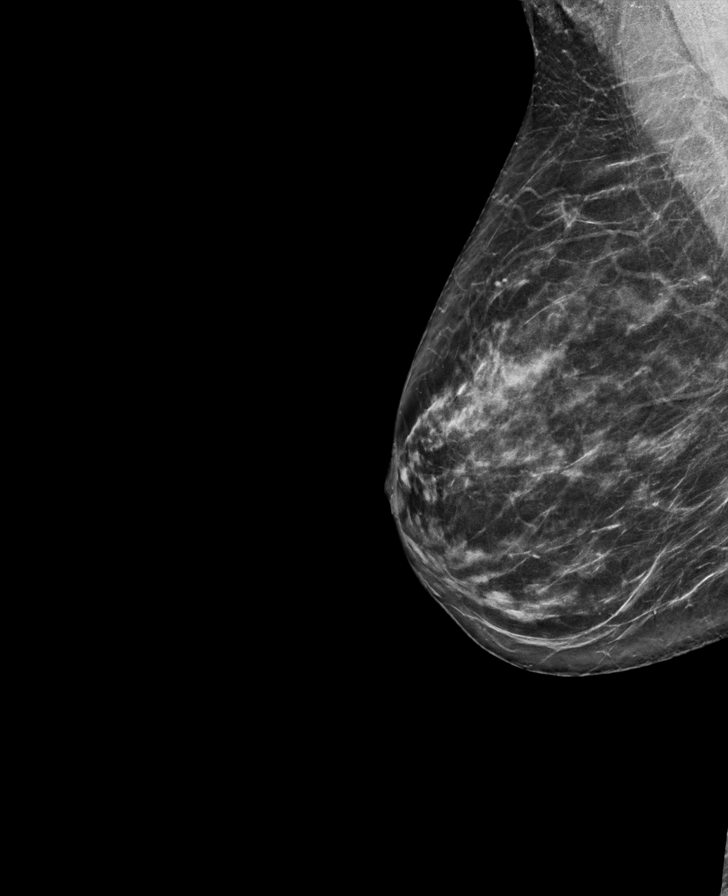

[L MLO synth-2D]
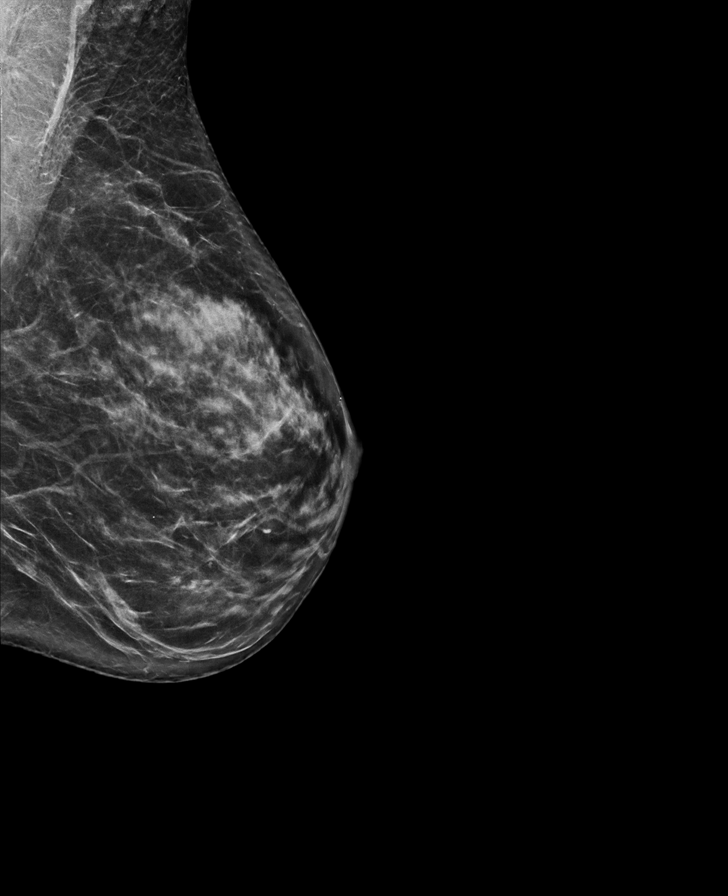

[L CC synth-2D]
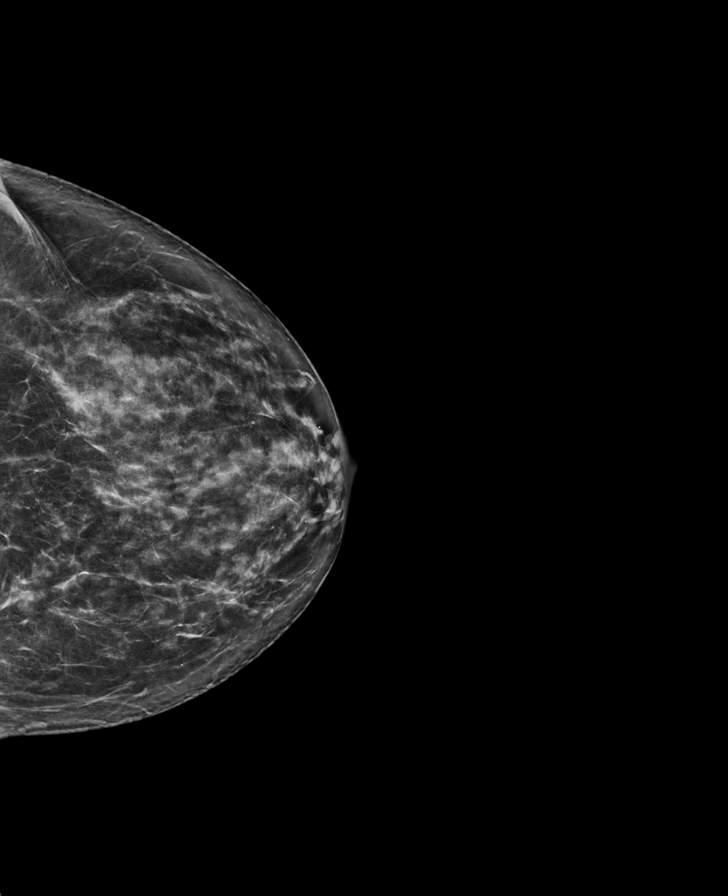

[R CC tomo · tomo slice 33/66.0]
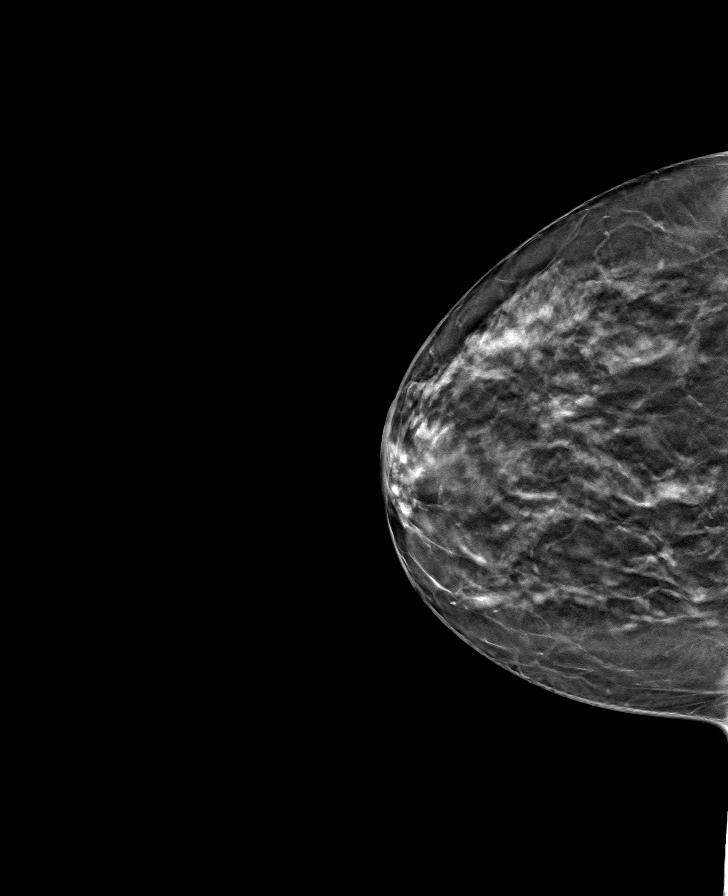

[R MLO tomo · tomo slice 37/72.0]
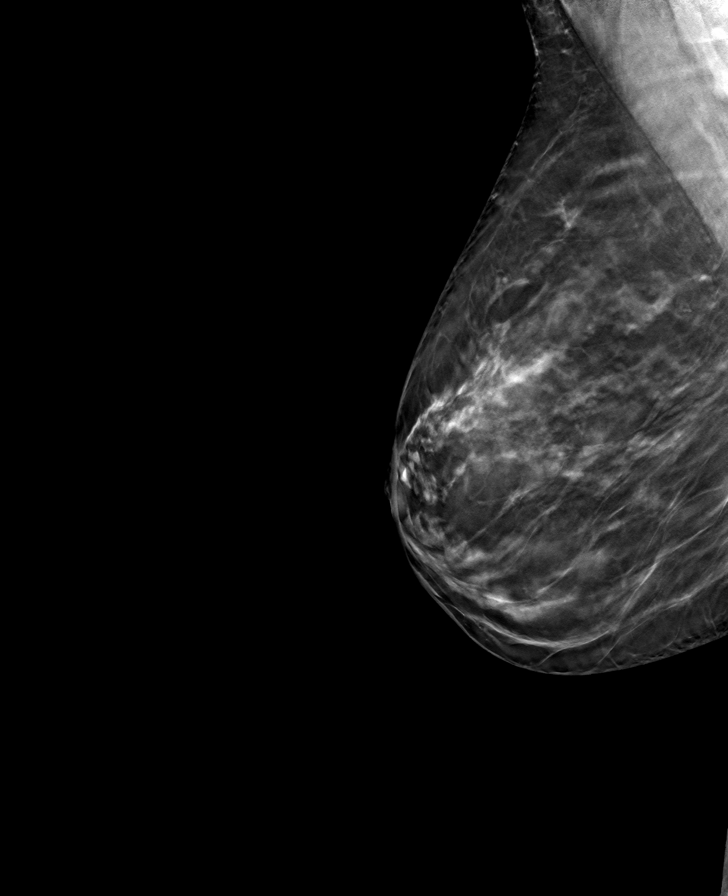

[L CC tomo · tomo slice 37/73.0]
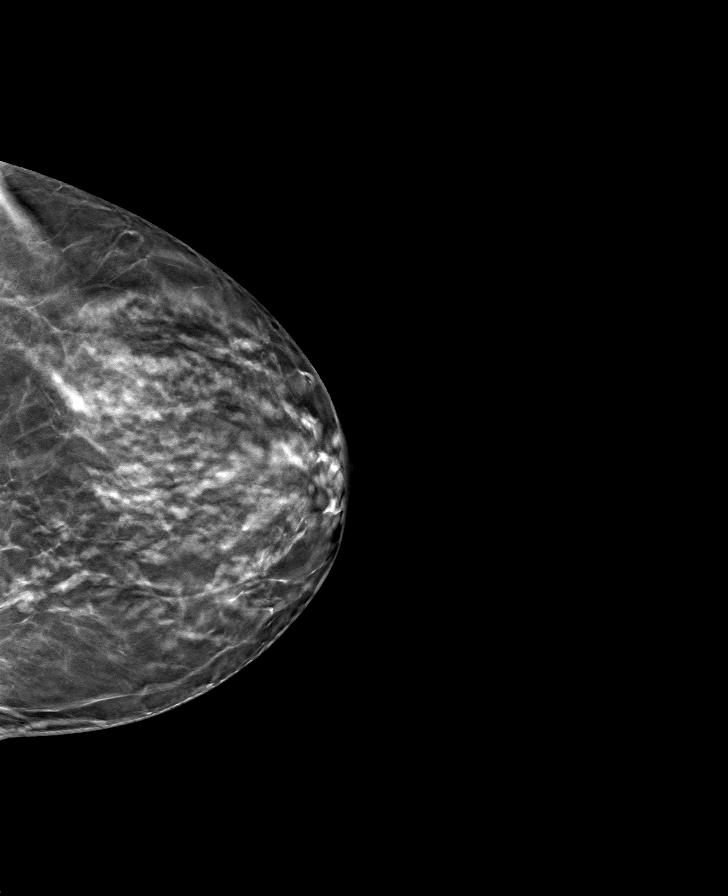

[L MLO tomo · tomo slice 37/72.0]
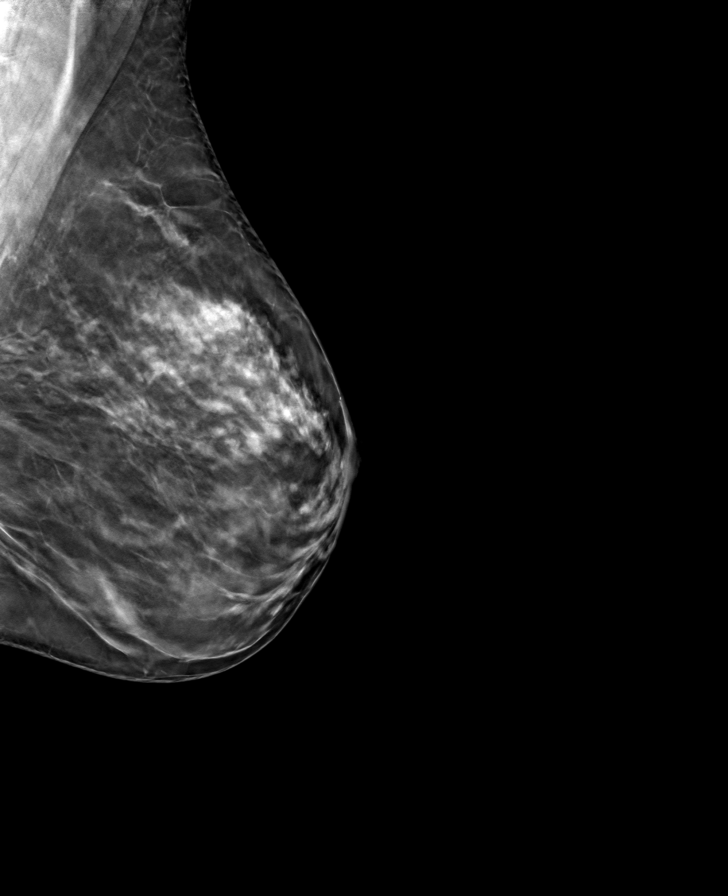

[8 of 24 positions shown; findings below may reference images not displayed]

ACR Breast Density Category c: The breast tissue is heterogeneously
dense, which may obscure small masses.
FINDINGS: There are no findings suspicious for malignancy. Images were
processed with CAD.
IMPRESSION: No mammographic evidence of malignancy. A result letter of this
screening mammogram will be mailed directly to the patient.

RECOMMENDATION:
Screening mammogram in one year. (Code:FT-U-LHB)

BI-RADS CATEGORY  1: Negative.

## 2020-07-19 DIAGNOSIS — F3341 Major depressive disorder, recurrent, in partial remission: Secondary | ICD-10-CM | POA: Diagnosis not present

## 2020-07-19 DIAGNOSIS — F419 Anxiety disorder, unspecified: Secondary | ICD-10-CM | POA: Diagnosis not present

## 2020-07-19 DIAGNOSIS — I10 Essential (primary) hypertension: Secondary | ICD-10-CM | POA: Diagnosis not present

## 2020-07-19 DIAGNOSIS — J0101 Acute recurrent maxillary sinusitis: Secondary | ICD-10-CM | POA: Diagnosis not present

## 2020-09-17 DIAGNOSIS — L720 Epidermal cyst: Secondary | ICD-10-CM | POA: Diagnosis not present

## 2020-09-17 DIAGNOSIS — L700 Acne vulgaris: Secondary | ICD-10-CM | POA: Diagnosis not present

## 2020-10-15 DIAGNOSIS — I10 Essential (primary) hypertension: Secondary | ICD-10-CM | POA: Diagnosis not present

## 2020-10-15 DIAGNOSIS — F3341 Major depressive disorder, recurrent, in partial remission: Secondary | ICD-10-CM | POA: Diagnosis not present

## 2020-10-15 DIAGNOSIS — F419 Anxiety disorder, unspecified: Secondary | ICD-10-CM | POA: Diagnosis not present

## 2020-10-15 DIAGNOSIS — G4700 Insomnia, unspecified: Secondary | ICD-10-CM | POA: Diagnosis not present

## 2021-03-12 DIAGNOSIS — Z1231 Encounter for screening mammogram for malignant neoplasm of breast: Secondary | ICD-10-CM | POA: Diagnosis not present

## 2021-03-29 DIAGNOSIS — N6489 Other specified disorders of breast: Secondary | ICD-10-CM | POA: Diagnosis not present

## 2021-03-29 DIAGNOSIS — R922 Inconclusive mammogram: Secondary | ICD-10-CM | POA: Diagnosis not present

## 2021-03-29 DIAGNOSIS — N6002 Solitary cyst of left breast: Secondary | ICD-10-CM | POA: Diagnosis not present

## 2021-04-03 DIAGNOSIS — K5100 Ulcerative (chronic) pancolitis without complications: Secondary | ICD-10-CM | POA: Diagnosis not present

## 2021-04-03 DIAGNOSIS — Z1211 Encounter for screening for malignant neoplasm of colon: Secondary | ICD-10-CM | POA: Diagnosis not present

## 2021-04-03 DIAGNOSIS — R197 Diarrhea, unspecified: Secondary | ICD-10-CM | POA: Diagnosis not present

## 2021-04-04 ENCOUNTER — Ambulatory Visit: Payer: BC Managed Care – PPO | Admitting: Obstetrics & Gynecology

## 2021-04-11 ENCOUNTER — Ambulatory Visit: Payer: BC Managed Care – PPO | Admitting: Obstetrics and Gynecology

## 2021-04-19 DIAGNOSIS — F419 Anxiety disorder, unspecified: Secondary | ICD-10-CM | POA: Diagnosis not present

## 2021-04-19 DIAGNOSIS — K219 Gastro-esophageal reflux disease without esophagitis: Secondary | ICD-10-CM | POA: Diagnosis not present

## 2021-04-19 DIAGNOSIS — F3341 Major depressive disorder, recurrent, in partial remission: Secondary | ICD-10-CM | POA: Diagnosis not present

## 2021-04-19 DIAGNOSIS — I10 Essential (primary) hypertension: Secondary | ICD-10-CM | POA: Diagnosis not present

## 2021-05-18 DIAGNOSIS — R300 Dysuria: Secondary | ICD-10-CM | POA: Diagnosis not present

## 2021-05-18 DIAGNOSIS — N3001 Acute cystitis with hematuria: Secondary | ICD-10-CM | POA: Diagnosis not present

## 2021-05-22 DIAGNOSIS — R197 Diarrhea, unspecified: Secondary | ICD-10-CM | POA: Diagnosis not present

## 2021-05-22 DIAGNOSIS — K6389 Other specified diseases of intestine: Secondary | ICD-10-CM | POA: Diagnosis not present

## 2021-05-30 ENCOUNTER — Ambulatory Visit: Payer: BC Managed Care – PPO | Admitting: Obstetrics & Gynecology

## 2021-06-27 ENCOUNTER — Ambulatory Visit: Payer: BC Managed Care – PPO | Admitting: Obstetrics and Gynecology

## 2021-10-04 DIAGNOSIS — N6012 Diffuse cystic mastopathy of left breast: Secondary | ICD-10-CM | POA: Diagnosis not present

## 2021-10-04 DIAGNOSIS — R922 Inconclusive mammogram: Secondary | ICD-10-CM | POA: Diagnosis not present

## 2021-10-04 DIAGNOSIS — N6489 Other specified disorders of breast: Secondary | ICD-10-CM | POA: Diagnosis not present

## 2021-10-11 DIAGNOSIS — F419 Anxiety disorder, unspecified: Secondary | ICD-10-CM | POA: Diagnosis not present

## 2021-10-11 DIAGNOSIS — I10 Essential (primary) hypertension: Secondary | ICD-10-CM | POA: Diagnosis not present

## 2021-10-11 DIAGNOSIS — Z23 Encounter for immunization: Secondary | ICD-10-CM | POA: Diagnosis not present

## 2021-10-11 DIAGNOSIS — F3341 Major depressive disorder, recurrent, in partial remission: Secondary | ICD-10-CM | POA: Diagnosis not present

## 2021-10-11 DIAGNOSIS — K219 Gastro-esophageal reflux disease without esophagitis: Secondary | ICD-10-CM | POA: Diagnosis not present

## 2021-10-11 DIAGNOSIS — Z13220 Encounter for screening for lipoid disorders: Secondary | ICD-10-CM | POA: Diagnosis not present

## 2021-10-11 DIAGNOSIS — Z0001 Encounter for general adult medical examination with abnormal findings: Secondary | ICD-10-CM | POA: Diagnosis not present
